# Patient Record
Sex: Male | Born: 1960 | Race: White | Hispanic: No | Marital: Married | Smoking: Never smoker
Health system: Southern US, Community
[De-identification: ages and names within clinical notes are randomized; demographics above are authoritative.]

## PROBLEM LIST (undated history)

## (undated) DIAGNOSIS — C4491 Basal cell carcinoma of skin, unspecified: Secondary | ICD-10-CM

---
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
## 1999-03-13 ENCOUNTER — Ambulatory Visit (HOSPITAL_COMMUNITY): Admission: RE | Admit: 1999-03-13 | Discharge: 1999-03-13 | Payer: Self-pay | Admitting: Endocrinology

## 1999-03-13 ENCOUNTER — Encounter: Payer: Self-pay | Admitting: Endocrinology

## 1999-03-15 ENCOUNTER — Ambulatory Visit (HOSPITAL_COMMUNITY): Admission: RE | Admit: 1999-03-15 | Discharge: 1999-03-15 | Payer: Self-pay | Admitting: Endocrinology

## 1999-03-15 ENCOUNTER — Encounter: Payer: Self-pay | Admitting: Endocrinology

## 2005-03-19 ENCOUNTER — Ambulatory Visit: Payer: Self-pay | Admitting: Internal Medicine

## 2005-05-07 ENCOUNTER — Ambulatory Visit: Payer: Self-pay | Admitting: Internal Medicine

## 2005-05-14 ENCOUNTER — Ambulatory Visit: Payer: Self-pay | Admitting: Internal Medicine

## 2008-11-29 ENCOUNTER — Ambulatory Visit: Payer: Self-pay | Admitting: Internal Medicine

## 2008-11-29 DIAGNOSIS — M25569 Pain in unspecified knee: Secondary | ICD-10-CM

## 2008-11-29 DIAGNOSIS — N419 Inflammatory disease of prostate, unspecified: Secondary | ICD-10-CM | POA: Insufficient documentation

## 2008-11-30 ENCOUNTER — Telehealth: Payer: Self-pay | Admitting: Internal Medicine

## 2008-12-27 ENCOUNTER — Ambulatory Visit: Payer: Self-pay | Admitting: Internal Medicine

## 2008-12-27 DIAGNOSIS — K4090 Unilateral inguinal hernia, without obstruction or gangrene, not specified as recurrent: Secondary | ICD-10-CM | POA: Insufficient documentation

## 2008-12-27 LAB — CONVERTED CEMR LAB
ALT: 40 units/L (ref 0–53)
AST: 47 units/L — ABNORMAL HIGH (ref 0–37)
Albumin: 4 g/dL (ref 3.5–5.2)
Alkaline Phosphatase: 46 units/L (ref 39–117)
BUN: 16 mg/dL (ref 6–23)
Basophils Absolute: 0 10*3/uL (ref 0.0–0.1)
Basophils Relative: 0.4 % (ref 0.0–3.0)
Bilirubin Urine: NEGATIVE
Bilirubin, Direct: 0.1 mg/dL (ref 0.0–0.3)
CO2: 29 meq/L (ref 19–32)
Calcium: 8.8 mg/dL (ref 8.4–10.5)
Chloride: 106 meq/L (ref 96–112)
Cholesterol: 153 mg/dL (ref 0–200)
Creatinine, Ser: 0.9 mg/dL (ref 0.4–1.5)
Eosinophils Absolute: 0.1 10*3/uL (ref 0.0–0.7)
Eosinophils Relative: 2.1 % (ref 0.0–5.0)
GFR calc Af Amer: 116 mL/min
GFR calc non Af Amer: 96 mL/min
Glucose, Bld: 94 mg/dL (ref 70–99)
Glucose, Urine, Semiquant: NEGATIVE
HCT: 46.1 % (ref 39.0–52.0)
HDL: 35.8 mg/dL — ABNORMAL LOW (ref >39.0)
Hemoglobin: 15.7 g/dL (ref 13.0–17.0)
Ketones, urine, test strip: NEGATIVE
LDL Cholesterol: 94 mg/dL (ref 0–99)
Lymphocytes Relative: 26.5 % (ref 12.0–46.0)
MCHC: 34.2 g/dL (ref 30.0–36.0)
MCV: 93 fL (ref 78.0–100.0)
Monocytes Absolute: 0.7 10*3/uL (ref 0.1–1.0)
Monocytes Relative: 14.2 % — ABNORMAL HIGH (ref 3.0–12.0)
Neutro Abs: 3 10*3/uL (ref 1.4–7.7)
Neutrophils Relative %: 56.8 % (ref 43.0–77.0)
Nitrite: NEGATIVE
Platelets: 180 10*3/uL (ref 150–400)
Potassium: 3.8 meq/L (ref 3.5–5.1)
Protein, U semiquant: NEGATIVE
RBC: 4.95 M/uL (ref 4.22–5.81)
RDW: 12 % (ref 11.5–14.6)
Sodium: 143 meq/L (ref 135–145)
Specific Gravity, Urine: <1.005
TSH: 4.72 microintl units/mL (ref 0.35–5.50)
Total Bilirubin: 0.9 mg/dL (ref 0.3–1.2)
Total CHOL/HDL Ratio: 4.3
Total Protein: 6.4 g/dL (ref 6.0–8.3)
Triglycerides: 116 mg/dL (ref 0–149)
Urobilinogen, UA: 0.2
VLDL: 23 mg/dL (ref 0–40)
WBC Urine, dipstick: NEGATIVE
WBC: 5.2 10*3/uL (ref 4.5–10.5)
pH: 5.5

## 2008-12-30 ENCOUNTER — Encounter: Payer: Self-pay | Admitting: Internal Medicine

## 2008-12-30 ENCOUNTER — Telehealth: Payer: Self-pay | Admitting: Internal Medicine

## 2009-01-10 ENCOUNTER — Ambulatory Visit: Payer: Self-pay | Admitting: Internal Medicine

## 2009-02-10 ENCOUNTER — Ambulatory Visit (HOSPITAL_COMMUNITY): Admission: RE | Admit: 2009-02-10 | Discharge: 2009-02-10 | Payer: Self-pay | Admitting: General Surgery

## 2009-02-21 ENCOUNTER — Encounter: Payer: Self-pay | Admitting: Internal Medicine

## 2009-03-30 ENCOUNTER — Encounter: Payer: Self-pay | Admitting: Internal Medicine

## 2009-11-11 IMAGING — CR DG KNEE 1-2V*R*
2 series · 2 of 2 positions shown · non-contrast
Comparison: None

CLINICAL DATA: Pain.  Prior surgery.

RIGHT KNEE - 1-2 VIEW

[view not recorded (1 of 2)]
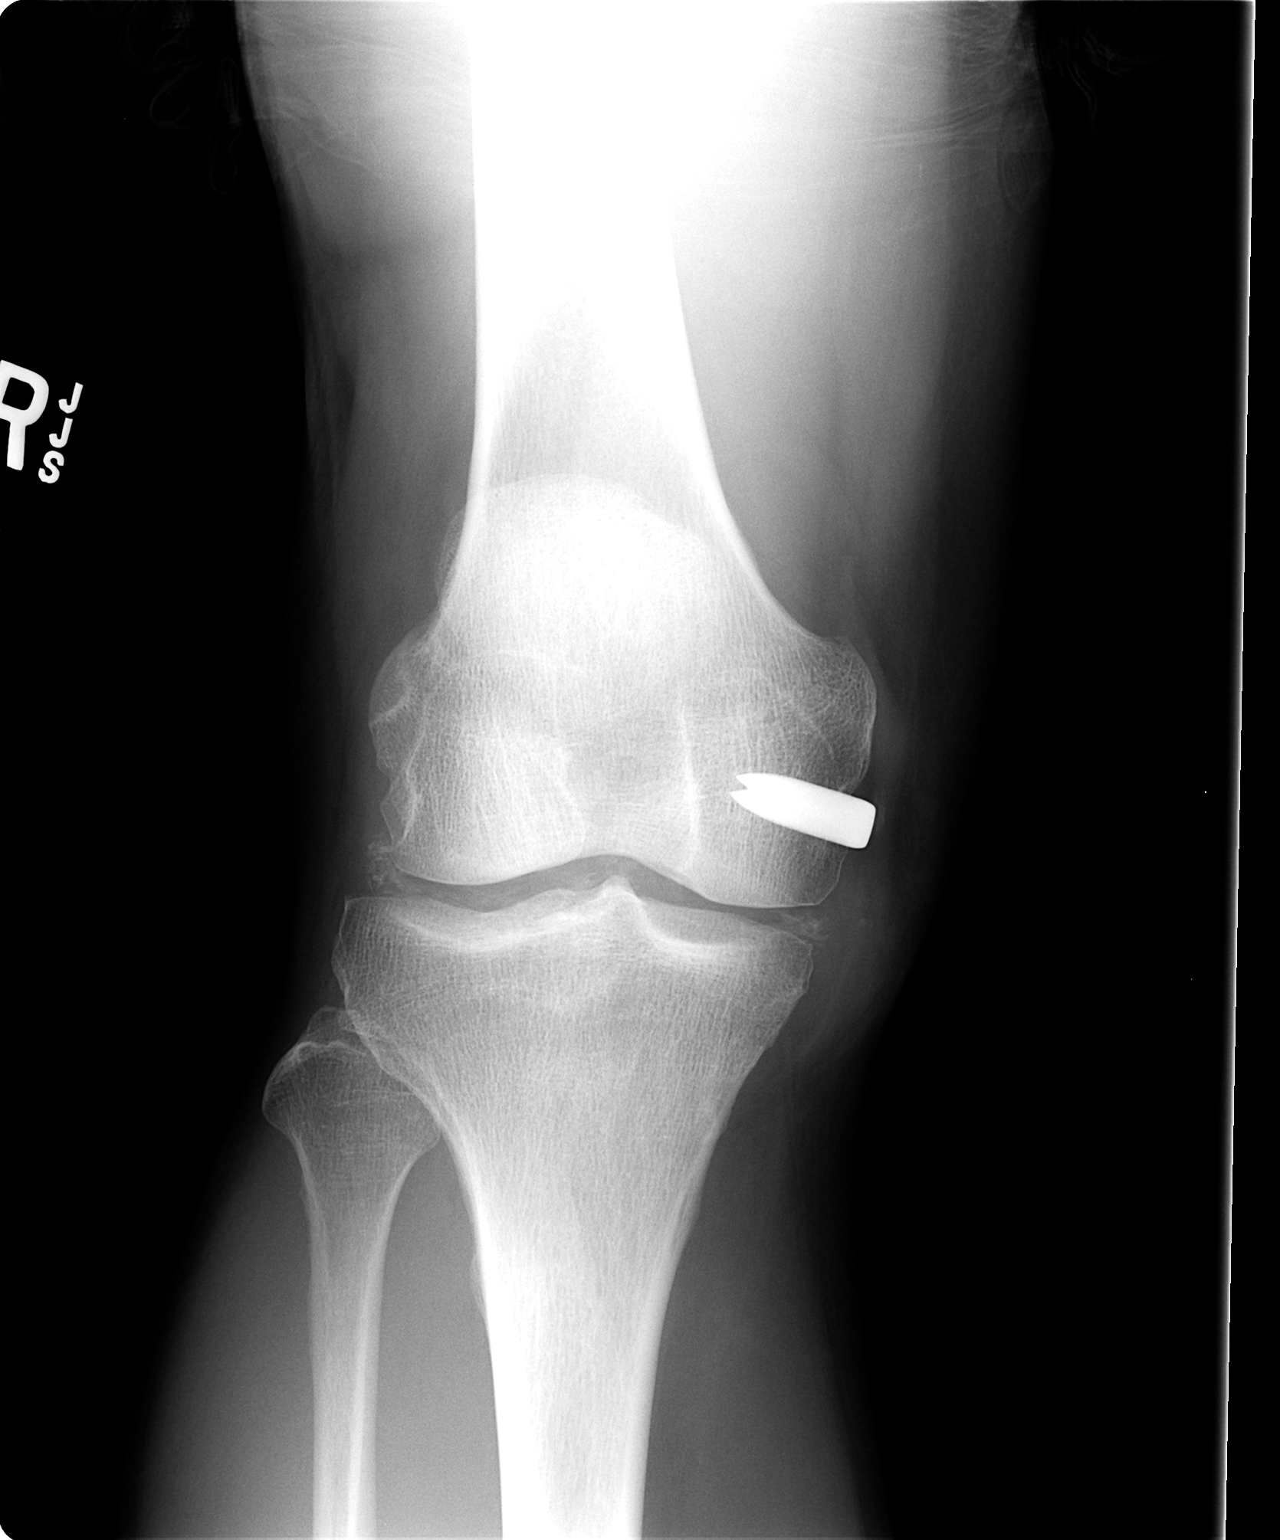

[view not recorded (2 of 2)]
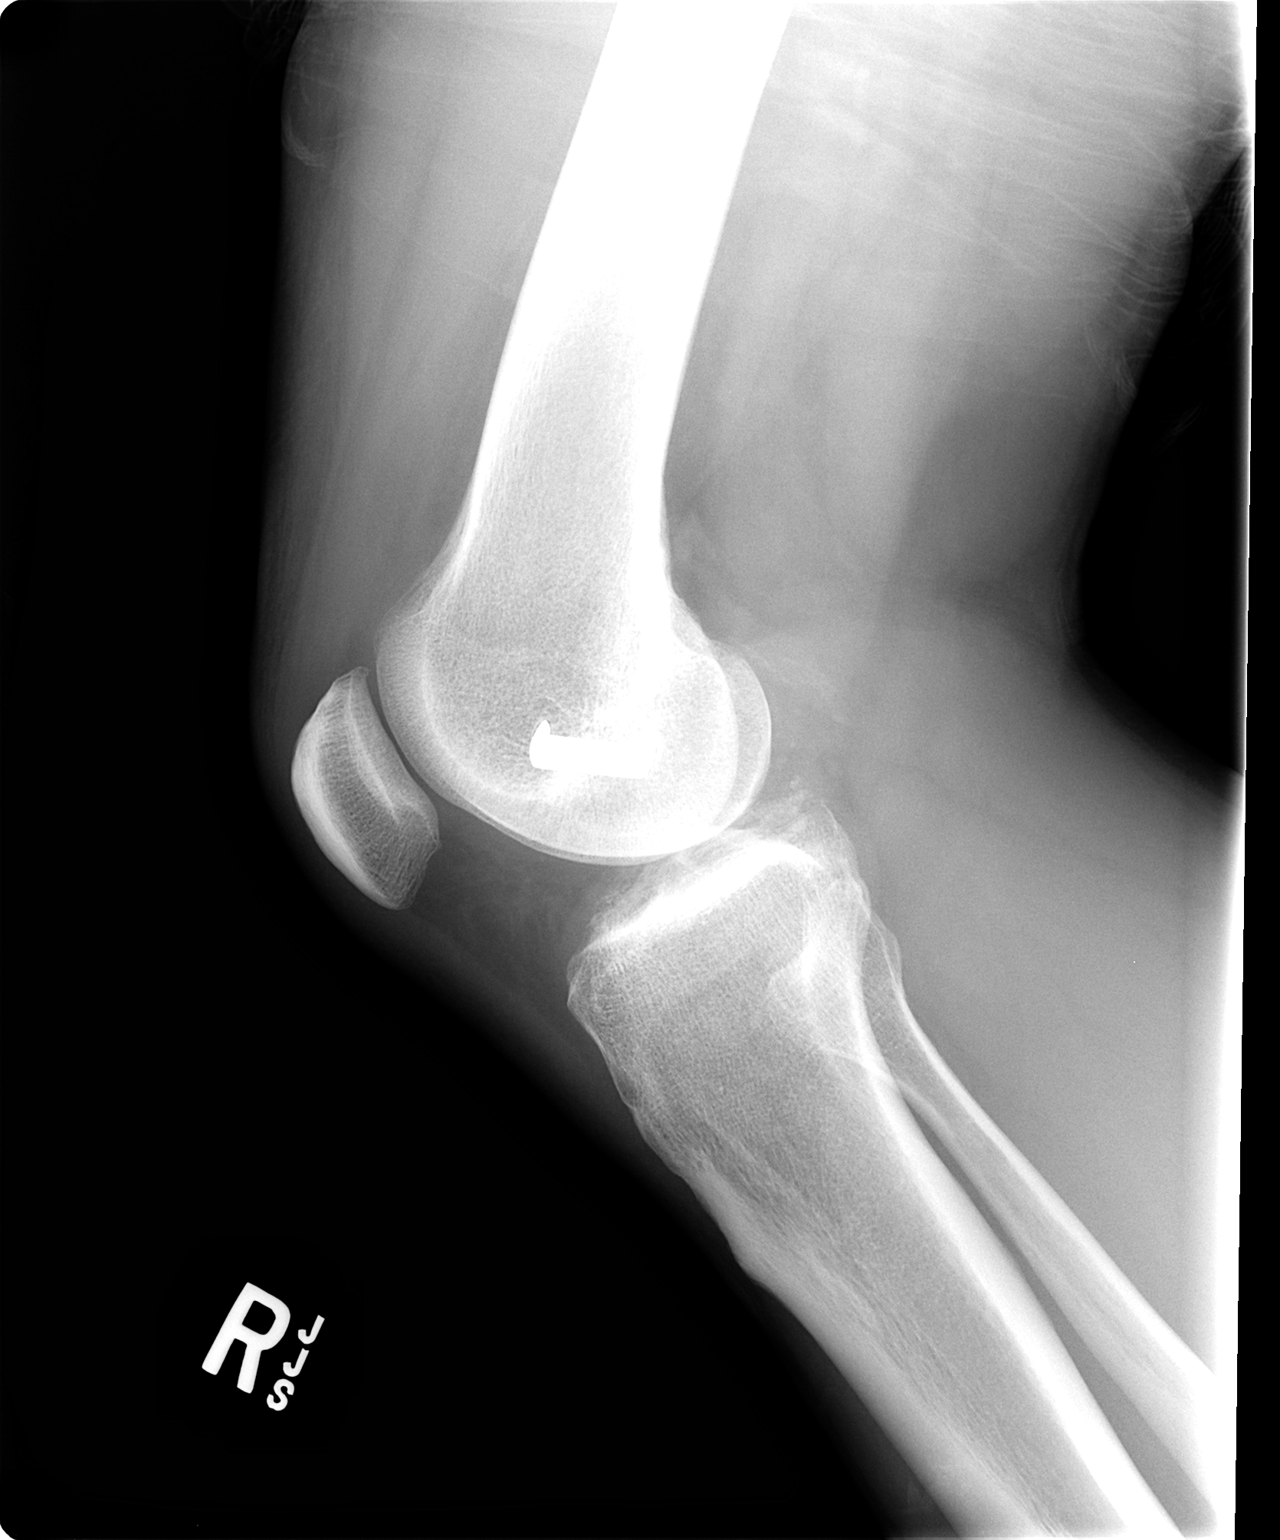

[2 of 2 positions shown; findings below may reference images not displayed]

FINDINGS: There is a small knee joint effusion.  There is a medial
femoral the staple presumably for treatment of previous medial
collateral ligament injury.  There is chondrocalcinosis of the
menisci.  Joint space heights are well preserved.  No focal osseous
lesion otherwise.
IMPRESSION: Chondrocalcinosis of the menisci.  This can be associated with
degeneration and tearing.  No joint space narrowing.  Small joint
effusion.  Previous medial femoral staple presumably for treatment
of MCL tear.

## 2011-02-14 LAB — BASIC METABOLIC PANEL
BUN: 11 mg/dL (ref 6–23)
CO2: 28 mEq/L (ref 19–32)
Calcium: 9.4 mg/dL (ref 8.4–10.5)
Chloride: 105 mEq/L (ref 96–112)
Creatinine, Ser: 0.9 mg/dL (ref 0.4–1.5)
GFR calc Af Amer: >60 mL/min (ref >60)
GFR calc non Af Amer: >60 mL/min (ref >60)
Glucose, Bld: 103 mg/dL — ABNORMAL HIGH (ref 70–99)
Potassium: 4.6 mEq/L (ref 3.5–5.1)
Sodium: 139 mEq/L (ref 135–145)

## 2011-02-14 LAB — CBC
HCT: 46.2 % (ref 39.0–52.0)
Hemoglobin: 16.5 g/dL (ref 13.0–17.0)
MCHC: 35.7 g/dL (ref 30.0–36.0)
MCV: 91.1 fL (ref 78.0–100.0)
Platelets: 239 K/uL (ref 150–400)
RBC: 5.07 MIL/uL (ref 4.22–5.81)
RDW: 12.7 % (ref 11.5–15.5)
WBC: 5.5 K/uL (ref 4.0–10.5)

## 2011-03-20 NOTE — Op Note (Signed)
Timothy Walls, Timothy Walls           ACCOUNT NO.:  [id number]   MEDICAL RECORD NO.:  [id number]          PATIENT TYPE:  AMB   LOCATION:  SDS                          FACILITY:  [facility]   PHYSICIAN:  Lennie Muckle, MD      DATE OF BIRTH:  25-Nov-1960   DATE OF PROCEDURE:  02/10/2009  DATE OF DISCHARGE:  02/10/2009                               OPERATIVE REPORT   PREOPERATIVE DIAGNOSIS:  Left inguinal hernia.   POSTOPERATIVE DIAGNOSIS:  Left inguinal hernia.   PROCEDURE:  Laparoscopic left hernia repair with mesh.   SURGEON:  Lennie Muckle, MD   ASSISTANT:  No assistant.   General endotracheal anesthesia.   FINDINGS:  Small hernia on the left.   No immediate complications.   Minimal blood loss.   No drains were placed.   INDICATION FOR PROCEDURE:  Timothy Walls is a 50 year old male who was  found to have left inguinal hernia.  He did have pain with heavy  exertion.  We had talked about laparoscopic and open repair.  Due to  quicker recovery time, he elected to have laparoscopic repair.  Informed  consent was obtained prior to procedure.   DETAILS OF PROCEDURE:  Timothy Walls was identified in the preoperative  holding area.  I marked his left groin.  He received 2 g of cefoxitin  and was taken to the operating room.  Once in the operating room, he was  placed in the supine position.  He did have a Foley catheter placed due  to feeling like he needed to void.  A time-out procedure indicating the  patient and the procedure were performed.  His abdomen and groin were  prepped and draped in usual sterile fashion.  An incision was placed  just beneath the umbilicus after anesthetizing his skin with 0.25%  Marcaine.  I identified the anterior rectus fascia.  This was incised  with a #11-blade.  I finger dissected the preperitoneal space and  palpated the rectus muscle laterally.  I then placed a balloon  Spacemaker Plus into the preperitoneal space.  I insufflated the balloon  while monitoring this with the camera.  I insufflated it approximately  30 times.  I waited a minute and then retracted the balloon cranially.  This was then reinflated under inspection with the camera.  Preperitoneal space had been dissected.  I waited approximately 2  minutes and then removed the balloon.  The small balloon on the port was  insufflated.  I insufflated the preperitoneal space, placed two 5-mm  trocars in the midline under visualization with camera.  I then began  dissecting laterally along the abdominal wall pushing the peritoneum  inferiorly.  I then continued down to the internal ring, dissected the  hernia sac away from the vas deferens and spermatic vessels.  A small  rent was repaired with a PDS loop.  I then continued dissecting the  peritoneum away from the spermatic cord and vessels.  I then placed a 3  x 6 piece of UltraPro mesh into the preperitoneal space.  This was  tacked with a ProTack device to [employer]  around the internal ring  and laterally while palpating the ProTack device.  I then held the  inferior edges while I released pneumoperitoneum.  The facial defect at  the umbilicus was closed after removing the trocar.  Skin was closed  with 4-0 Monocryl after these trocars were removed.  Dermabond was  placed with final dressing.  The patient was extubated, transported to  postanesthesia care unit in stable condition.  He will be discharged  home with Percocet.  Follow up with me in approximately 2 or 3 weeks.      Lennie Muckle, MD  Electronically Signed     ALA/MEDQ  D:  02/10/2009  T:  02/11/2009  Job:  [id number]   cc:   Valetta Mole. Swords, MD

## 2011-05-22 ENCOUNTER — Telehealth (INDEPENDENT_AMBULATORY_CARE_PROVIDER_SITE_OTHER): Payer: Self-pay | Admitting: General Surgery

## 2011-05-22 ENCOUNTER — Telehealth: Payer: Self-pay | Admitting: Internal Medicine

## 2011-05-22 NOTE — Telephone Encounter (Signed)
Pt had td on 05/14/05. Pt aware

## 2011-05-22 NOTE — Telephone Encounter (Signed)
Pt called and needs to know date of last tdap? Or if pt has ever had this booster.

## 2013-04-06 ENCOUNTER — Ambulatory Visit: Payer: Self-pay

## 2013-04-06 ENCOUNTER — Other Ambulatory Visit: Payer: Self-pay | Admitting: Occupational Medicine

## 2013-04-06 DIAGNOSIS — R52 Pain, unspecified: Secondary | ICD-10-CM

## 2014-02-05 IMAGING — CR DG SHOULDER 2+V*R*
3 series · 3 of 3 positions shown · non-contrast
Comparison: None.

CLINICAL DATA: Fall.  Right shoulder pain.

RIGHT SHOULDER - 2+ VIEW

[view not recorded (1 of 3)]
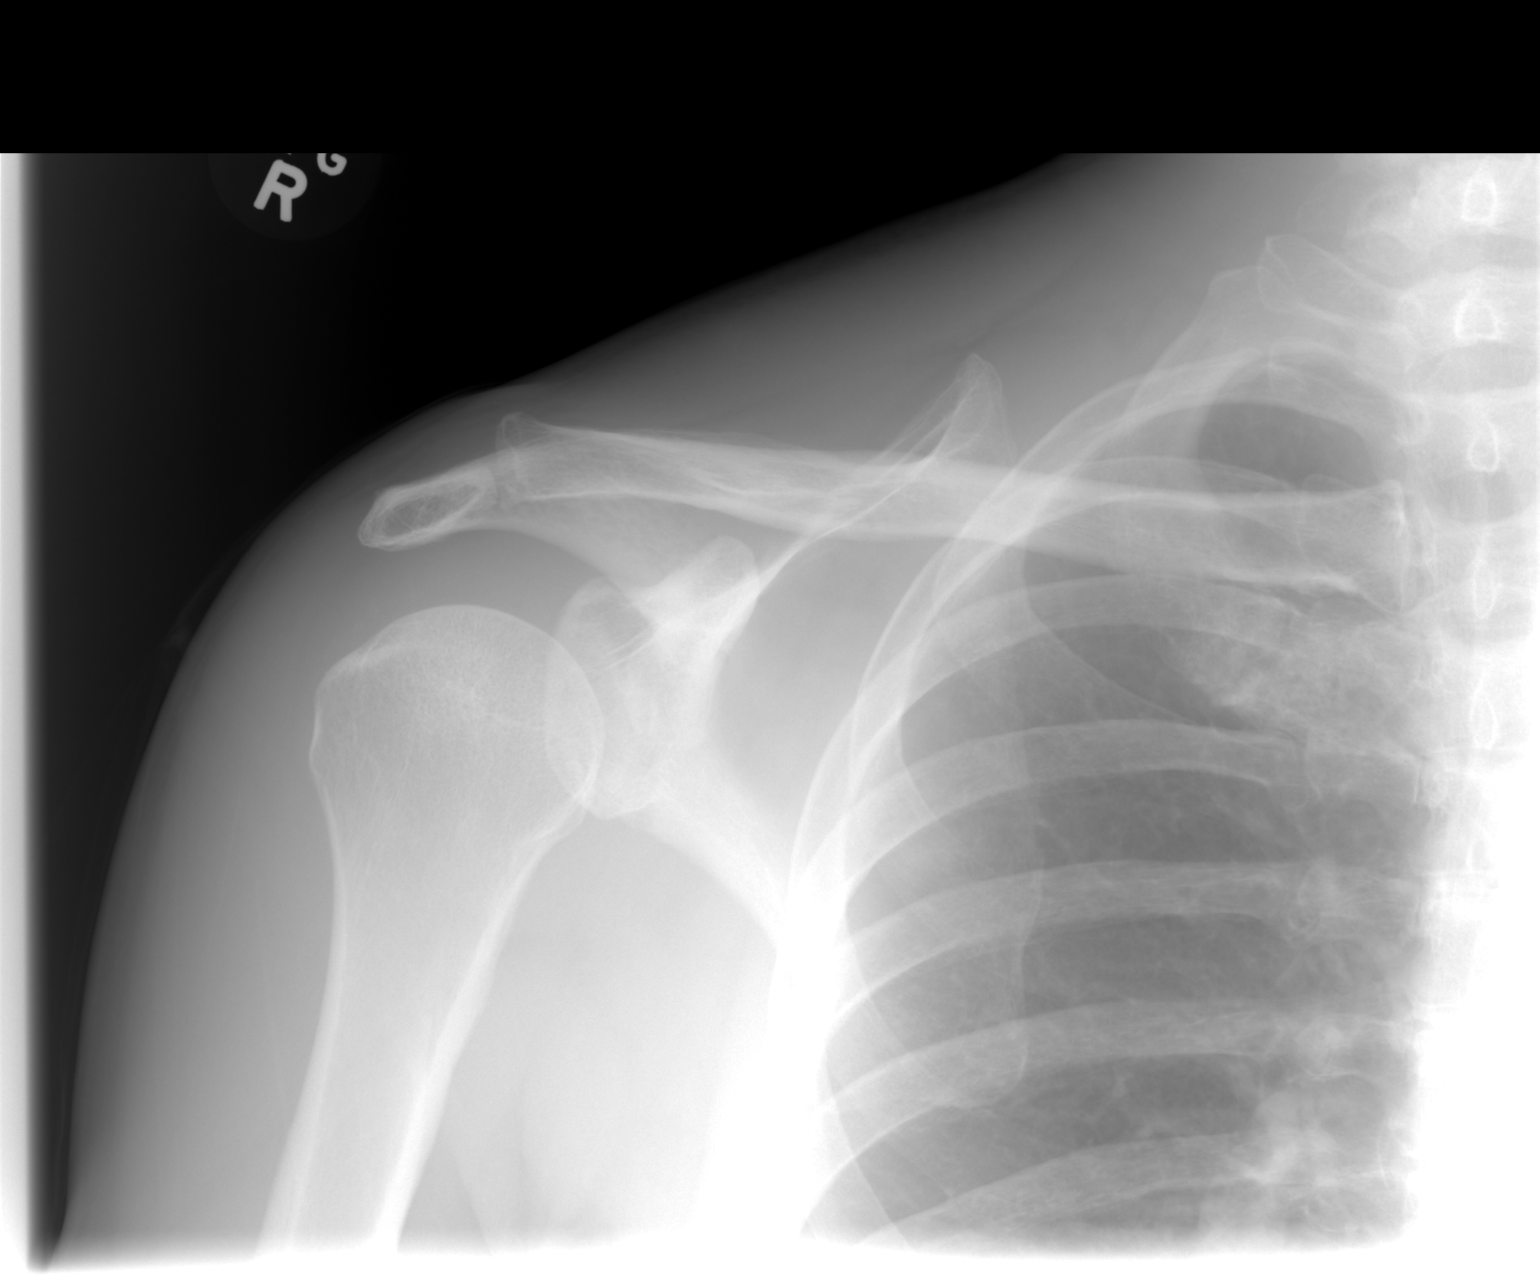

[view not recorded (2 of 3)]
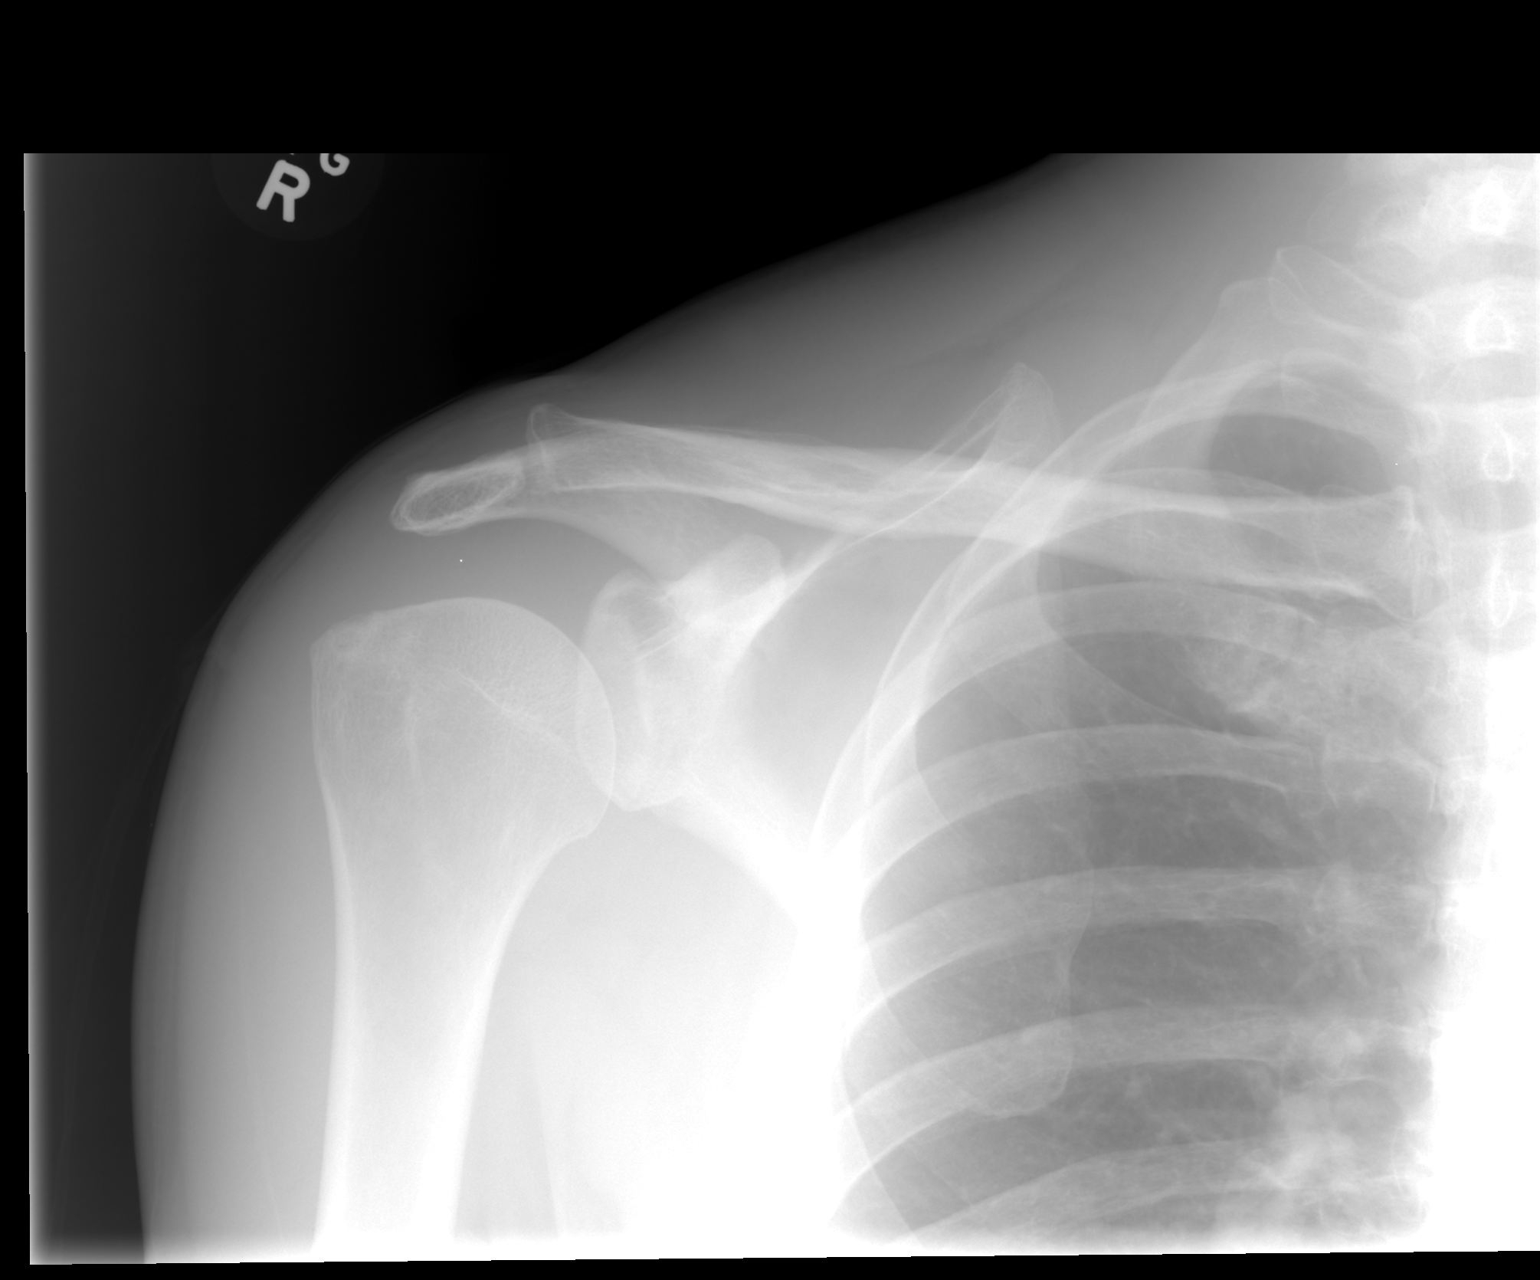

[view not recorded (3 of 3)]
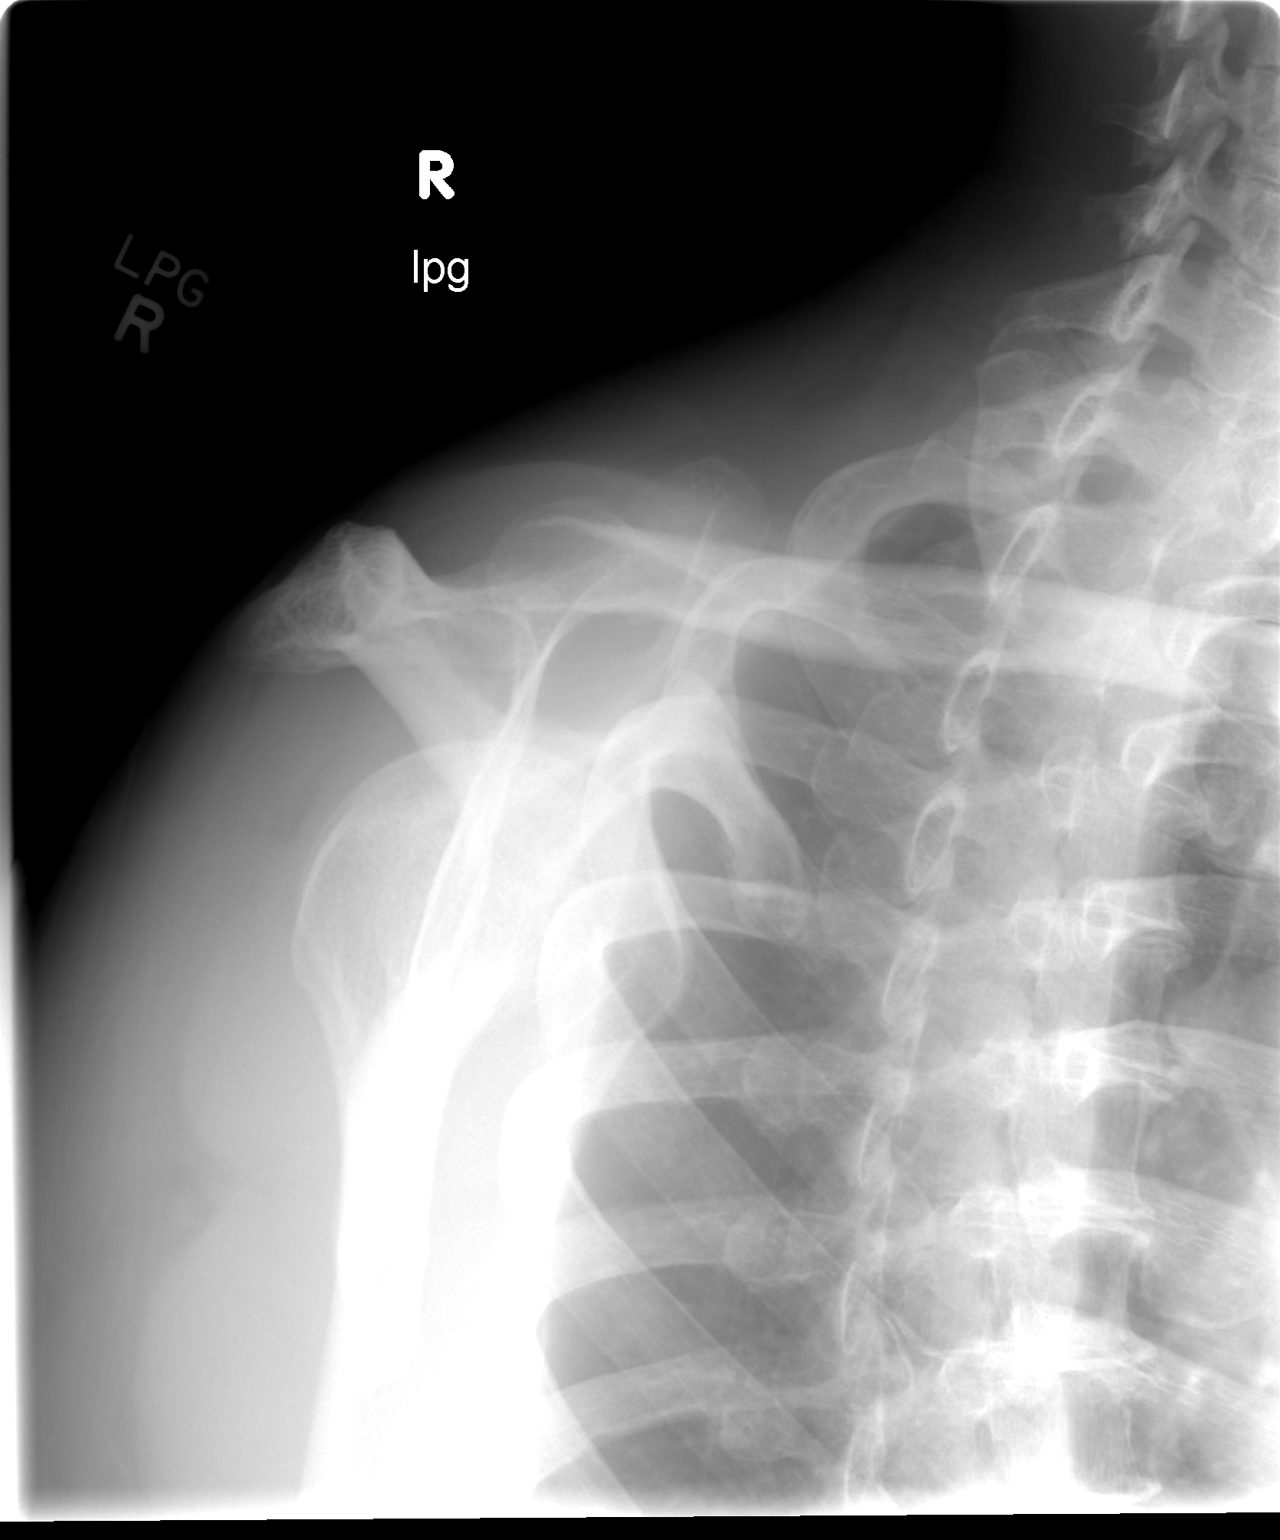

[3 of 3 positions shown; findings below may reference images not displayed]

FINDINGS: There is no acute bony or joint.  Mild to moderate
acromioclavicular degenerative change is noted.  Imaged right lung
and ribs are clear.
IMPRESSION: No acute finding.  Mild to moderate acromioclavicular degenerative
disease.

## 2015-05-20 DIAGNOSIS — C4491 Basal cell carcinoma of skin, unspecified: Secondary | ICD-10-CM

## 2015-05-20 HISTORY — DX: Basal cell carcinoma of skin, unspecified: C44.91

## 2019-07-09 ENCOUNTER — Encounter: Payer: Self-pay | Admitting: *Deleted

## 2020-01-10 ENCOUNTER — Ambulatory Visit: Payer: BC Managed Care – PPO | Attending: Internal Medicine

## 2020-01-10 DIAGNOSIS — Z23 Encounter for immunization: Secondary | ICD-10-CM

## 2020-01-10 NOTE — Progress Notes (Signed)
   Covid-19 Vaccination Clinic  Name:  Timothy Walls    MRN: [id number] DOB: 04-30-1961  01/10/2020  [name] was observed post Covid-19 immunization for 15 minutes without incident. He was provided with Vaccine Information Sheet and instruction to access the V-Safe system.   [name] was instructed to call 911 with any severe reactions post vaccine: Marland Kitchen Difficulty breathing  . Swelling of face and throat  . A fast heartbeat  . A bad rash all over body  . Dizziness and weakness   Immunizations Administered    Name Date Dose VIS Date Route   Pfizer COVID-19 Vaccine 01/10/2020  4:47 PM 0.3 mL 10/16/2019 Intramuscular   Manufacturer: [place]   Lot: [id number]   [place]: [id number]

## 2020-02-09 ENCOUNTER — Ambulatory Visit: Payer: BC Managed Care – PPO | Attending: Internal Medicine

## 2020-02-09 DIAGNOSIS — Z23 Encounter for immunization: Secondary | ICD-10-CM

## 2020-02-09 NOTE — Progress Notes (Signed)
   Covid-19 Vaccination Clinic  Name:  ANEL ANGSTADT    MRN: [id number] DOB: 12-19-1960  02/09/2020  Mr. Freiman was observed post Covid-19 immunization for 15 minutes without incident. He was provided with Vaccine Information Sheet and instruction to access the V-Safe system.   Mr. Issac was instructed to call 911 with any severe reactions post vaccine: Marland Kitchen Difficulty breathing  . Swelling of face and throat  . A fast heartbeat  . A bad rash all over body  . Dizziness and weakness   Immunizations Administered    Name Date Dose VIS Date Route   Pfizer COVID-19 Vaccine 02/09/2020  2:58 PM 0.3 mL 10/16/2019 Intramuscular   Manufacturer: [place]   Lot: [id number]   [place]: [id number]

## 2020-07-15 ENCOUNTER — Encounter: Payer: Self-pay | Admitting: Cardiology

## 2020-07-15 ENCOUNTER — Other Ambulatory Visit: Payer: Self-pay

## 2020-07-15 ENCOUNTER — Ambulatory Visit: Payer: BC Managed Care – PPO | Admitting: Cardiology

## 2020-07-15 VITALS — BP 122/76 | HR 76 | Ht 65.0 in | Wt 165.0 lb

## 2020-07-15 DIAGNOSIS — Z87898 Personal history of other specified conditions: Secondary | ICD-10-CM | POA: Diagnosis not present

## 2020-07-15 DIAGNOSIS — Z8249 Family history of ischemic heart disease and other diseases of the circulatory system: Secondary | ICD-10-CM

## 2020-07-15 DIAGNOSIS — Z7189 Other specified counseling: Secondary | ICD-10-CM

## 2020-07-15 DIAGNOSIS — Z13220 Encounter for screening for lipoid disorders: Secondary | ICD-10-CM | POA: Diagnosis not present

## 2020-07-15 NOTE — Progress Notes (Signed)
Cardiology Office Note:    Date:  07/15/2020   ID:  Timothy Walls, DOB 26-Mar-1961, MRN [id number]  PCP:  Donald Prose, MD  Cardiologist:  Buford Dresser, MD  Referring MD: Donald Prose, MD   CC: new patient consultation for chest pain, family history of CV disease  History of Present Illness:    Timothy Walls is a 59 y.o. male without prior cardiac history who is seen as a new consult at the request of Sun, Vyvyan, MD for the evaluation and management of chest discomfort, family history of cardiovascular disease.  Note reviewed from Dr. Nancy Fetter on 05/20/20. Referred for cardiac evaluation given strong family history. Prior MSK chest pain after moving heavy pallets a year ago. Not recurred.  Cardiovascular risk factors: Prior clinical ASCVD: none Comorbid conditions: Denies hypertension, hyperlipidemia, diabetes, chronic kidney disease:  Metabolic syndrome/Obesity: BMI 27, highest adult weight was 180 lbs when weightlifting Chronic inflammatory conditions: none Tobacco use history: never  Family history:  Has 2 siblings.  -Brother is 16, just had two stents placed over the summer. No prior heart issues. Had daily headaches, but was taking excedrin nearly daily. Symptom was angina with stairs, brain fog. Wasn't on medications for high blood pressure/high cholesterol prior to stents. Nonsmoker.  -sister is 40, no health issues that he knows of  Father: has had high cholesterol, taking medication for it.  Mother: died of heart failure in 02/04/2015 at age 31. Had neuropathy, dealt with heart failure for about a year before her death. Believes she had a stent placed at one point.  Mat Gpa: no heart issues, died of ALS around age 18 Mat Gma: died of liver cancer around age 20  Pat 53: died young from emphysema Pat Gma: unknown, did have brain tumor at some point.  No heart attack/stroke in extended family except for maternal uncle with CAD/CABG 15 years ago (he is still  alive). Has pat uncle with pacemaker.   Prior cardiac testing and/or incidental findings on other testing (ie coronary calcium): only ECGs Exercise level: active at work, walks a lot during the day. Does weightlifting, strength training as well. 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 Gets 10-15k steps/day Current diet: aiming for plant based diet. Does eat salmon. Beans, almonds, walnuts. Stopped dairy. Also doing intermittent fasting  Reviewed labs through [facility], from 05/20/20: Tchol 162, HDL 46, LDL 103, TG 69.  Denies chest pain, shortness of breath at rest or with normal exertion. No PND, orthopnea, LE edema or unexpected weight gain. No syncope or palpitations.  Past Medical History:  Diagnosis Date  . Basal cell carcinoma 05/20/2015   right upper back(CX35FU)    History reviewed. No pertinent surgical history.  Current Medications: No current outpatient medications on file prior to visit.   No current facility-administered medications on file prior to visit.     Allergies:   Erythromycin   Social History   Tobacco Use  . Smoking status: Never Smoker  . Smokeless tobacco: Never Used  Substance Use Topics  . Alcohol use: Not on file  . Drug use: Not on file    Family History: Please see extensive family history in HPI  ROS:   Please see the history of present illness.  Additional pertinent ROS: Constitutional: Negative for chills, fever, night sweats, unintentional weight loss  HENT: Negative for ear pain and hearing loss.   Eyes: Negative for loss of vision and eye pain.  Respiratory: Negative for cough, sputum, wheezing.  Cardiovascular: See HPI. Gastrointestinal: Negative for abdominal pain, melena, and hematochezia.  Genitourinary: Negative for dysuria and hematuria.  Musculoskeletal: Negative for falls and myalgias.  Skin: Negative for itching and rash.  Neurological: Negative for focal weakness, focal sensory changes and loss of consciousness.  Endo/Heme/Allergies:  Does not bruise/bleed easily.     EKGs/Labs/Other Studies Reviewed:    The following studies were reviewed today: No prior cardiac studies  EKG:  EKG is personally reviewed.  The ekg ordered today demonstrates NSR at 76 bpm  Recent Labs: No results found for requested labs within last 8760 hours.  Recent Lipid Panel    Component Value Date/Time   CHOL 153 12/27/2008 0949   TRIG 116 12/27/2008 0949   HDL 35.8 (L) 12/27/2008 0949   CHOLHDL 4.3 CALC 12/27/2008 0949   VLDL 23 12/27/2008 0949   LDLCALC 94 12/27/2008 0949    Physical Exam:    VS:  BP 122/76   Pulse 76   Ht 5\' 5"  (1.651 m)   Wt 165 lb (74.8 kg)   BMI 27.46 kg/m     Wt Readings from Last 3 Encounters:  07/15/20 165 lb (74.8 kg)    GEN: Well nourished, well developed in no acute distress HEENT: Normal, moist mucous membranes NECK: No JVD CARDIAC: regular rhythm, normal S1 and S2, no rubs or gallops. No murmurs. VASCULAR: Radial and DP pulses 2+ bilaterally. No carotid bruits RESPIRATORY:  Clear to auscultation without rales, wheezing or rhonchi  ABDOMEN: Soft, non-tender, non-distended MUSCULOSKELETAL:  Ambulates independently SKIN: Warm and dry, no edema NEUROLOGIC:  Alert and oriented x 3. No focal neuro deficits noted. PSYCHIATRIC:  Normal affect    ASSESSMENT:    1. History of chest pain   2. Family history of heart disease   3. Screening for lipid disorders   4. Cardiac risk counseling   5. Counseling on health promotion and disease prevention    PLAN:    History of chest pain: occurred after heavy lifting, has not recurred -counseled on red flag warning signs that need immediate medical attention  Family history of heart disease -his 10 year ASCVD risk score is 7.0%. -we discussed calcium score, lp(a), hs-CRP as ways to further evaluate his personal risk -he would like to proceed with lp(a) and CRP now, will consider calcium score  Cardiac risk counseling and prevention  recommendations: -recommend heart healthy/Mediterranean diet, with whole grains, fruits, vegetable, fish, lean meats, nuts, and olive oil. Limit salt. -recommend moderate walking, 3-5 times/week for 30-50 minutes each session. Aim for at least 150 minutes.week. Goal should be pace of 3 miles/hours, or walking 1.5 miles in 30 minutes -recommend avoidance of tobacco products. Avoid excess alcohol.  Plan for follow up: 1 year or sooner as needed  Buford Dresser, MD, PhD [place]  [facility]    Medication Adjustments/Labs and Tests Ordered: Current medicines are reviewed at length with the patient today.  Concerns regarding medicines are outlined above.  Orders Placed This Encounter  Procedures  . CRP High sensitivity  . Lipoprotein A (LPA)  . Lipid panel  . EKG 12-Lead   No orders of the defined types were placed in this encounter.   Patient Instructions  Medication Instructions:  Your Physician recommend you continue on your current medication as directed.    *If you need a refill on your cardiac medications before your next appointment, please call your pharmacy*   Lab Work: Your physician recommends that you return for lab work today (  lipid, LPa, CRP)  If you have labs (blood work) drawn today and your tests are completely normal, you will receive your results only by: Marland Kitchen MyChart Message (if you have MyChart) OR . A paper copy in the mail If you have any lab test that is abnormal or we need to change your treatment, we will call you to review the results.   Testing/Procedures: None ordered    Follow-Up: At [facility], you and your health needs are our priority.  As part of our continuing mission to provide you with exceptional heart care, we have created designated Provider Care Teams.  These Care Teams include your primary Cardiologist (physician) and Advanced Practice Providers (APPs -  Physician Assistants and Nurse Practitioners) who all work  together to provide you with the care you need, when you need it.  We recommend signing up for the patient portal called "MyChart".  Sign up information is provided on this After Visit Summary.  MyChart is used to connect with patients for Virtual Visits (Telemedicine).  Patients are able to view lab/test results, encounter notes, upcoming appointments, etc.  Non-urgent messages can be sent to your provider as well.   To learn more about what you can do with MyChart, go to [email or web address].    Your next appointment:   1 year(s)  The format for your next appointment:   In Person  Provider:   Buford Dresser, MD      Signed, Buford Dresser, MD PhD 07/15/2020  [place]

## 2020-07-15 NOTE — Patient Instructions (Signed)
Medication Instructions:  Your Physician recommend you continue on your current medication as directed.    *If you need a refill on your cardiac medications before your next appointment, please call your pharmacy*   Lab Work: Your physician recommends that you return for lab work today ( lipid, LPa, CRP)  If you have labs (blood work) drawn today and your tests are completely normal, you will receive your results only by: Marland Kitchen MyChart Message (if you have MyChart) OR . A paper copy in the mail If you have any lab test that is abnormal or we need to change your treatment, we will call you to review the results.   Testing/Procedures: None ordered    Follow-Up: At [facility], you and your health needs are our priority.  As part of our continuing mission to provide you with exceptional heart care, we have created designated Provider Care Teams.  These Care Teams include your primary Cardiologist (physician) and Advanced Practice Providers (APPs -  Physician Assistants and Nurse Practitioners) who all work together to provide you with the care you need, when you need it.  We recommend signing up for the patient portal called "MyChart".  Sign up information is provided on this After Visit Summary.  MyChart is used to connect with patients for Virtual Visits (Telemedicine).  Patients are able to view lab/test results, encounter notes, upcoming appointments, etc.  Non-urgent messages can be sent to your provider as well.   To learn more about what you can do with MyChart, go to [email or web address].    Your next appointment:   1 year(s)  The format for your next appointment:   In Person  Provider:   Buford Dresser, MD

## 2020-07-17 LAB — LIPID PANEL
Chol/HDL Ratio: 3.8 ratio (ref 0.0–5.0)
Cholesterol, Total: 195 mg/dL (ref 100–199)
HDL: 52 mg/dL (ref >39)
LDL Chol Calc (NIH): 117 mg/dL — ABNORMAL HIGH (ref 0–99)
Triglycerides: 145 mg/dL (ref 0–149)
VLDL Cholesterol Cal: 26 mg/dL (ref 5–40)

## 2020-07-17 LAB — LIPOPROTEIN A (LPA): Lipoprotein (a): 17.7 nmol/L (ref <75.0)

## 2020-07-17 LAB — HIGH SENSITIVITY CRP: CRP, High Sensitivity: 1.32 mg/L (ref 0.00–3.00)

## 2020-08-01 ENCOUNTER — Telehealth: Payer: Self-pay | Admitting: Cardiology

## 2020-08-01 NOTE — Telephone Encounter (Signed)
Pt notified of Dr Judeth Cornfield results and recommendations. Pt is asking if Dr Harrell Gave is recommending to have this CT done with his cholesterol numbers? Or is this just a suggestion? Please advise

## 2020-08-01 NOTE — Telephone Encounter (Signed)
Patient returning call for lab results. 

## 2020-08-09 NOTE — Telephone Encounter (Signed)
Left message to call back  

## 2020-08-09 NOTE — Telephone Encounter (Signed)
It is an optional test--not urgent but is the last option for determining if he has elevated risk for heart disease. We can order if he is interested, but it is not covered by insurance ($150 out of pocket). Otherwise we can discuss again in the future and review if there are new recommendations for risk assessment.

## 2020-08-10 NOTE — Telephone Encounter (Signed)
Left message to call back  

## 2020-08-11 NOTE — Telephone Encounter (Signed)
Pt has an appointment scheduled for today 10/7 with Dr. Harrell Gave.

## 2020-08-26 ENCOUNTER — Encounter: Payer: Self-pay | Admitting: Cardiology

## 2022-03-16 DIAGNOSIS — H52203 Unspecified astigmatism, bilateral: Secondary | ICD-10-CM | POA: Diagnosis not present

## 2022-03-16 DIAGNOSIS — H2513 Age-related nuclear cataract, bilateral: Secondary | ICD-10-CM | POA: Diagnosis not present

## 2022-03-16 DIAGNOSIS — H33302 Unspecified retinal break, left eye: Secondary | ICD-10-CM | POA: Diagnosis not present

## 2022-03-16 DIAGNOSIS — H1789 Other corneal scars and opacities: Secondary | ICD-10-CM | POA: Diagnosis not present

## 2022-06-05 ENCOUNTER — Ambulatory Visit: Payer: BC Managed Care – PPO | Admitting: Physician Assistant

## 2022-06-25 DIAGNOSIS — Z125 Encounter for screening for malignant neoplasm of prostate: Secondary | ICD-10-CM | POA: Diagnosis not present

## 2022-06-25 DIAGNOSIS — Z13220 Encounter for screening for lipoid disorders: Secondary | ICD-10-CM | POA: Diagnosis not present

## 2022-06-25 DIAGNOSIS — Z0000 Encounter for general adult medical examination without abnormal findings: Secondary | ICD-10-CM | POA: Diagnosis not present

## 2023-07-15 DIAGNOSIS — Z13220 Encounter for screening for lipoid disorders: Secondary | ICD-10-CM | POA: Diagnosis not present

## 2023-07-15 DIAGNOSIS — Z125 Encounter for screening for malignant neoplasm of prostate: Secondary | ICD-10-CM | POA: Diagnosis not present

## 2023-07-15 DIAGNOSIS — Z0000 Encounter for general adult medical examination without abnormal findings: Secondary | ICD-10-CM | POA: Diagnosis not present
